# Patient Record
Sex: Male | Born: 1955
Health system: Southern US, Community
[De-identification: ages and names within clinical notes are randomized; demographics above are authoritative.]

## PROBLEM LIST (undated history)

## (undated) DIAGNOSIS — T7840XA Allergy, unspecified, initial encounter: Secondary | ICD-10-CM

## (undated) DIAGNOSIS — M1990 Unspecified osteoarthritis, unspecified site: Secondary | ICD-10-CM

## (undated) DIAGNOSIS — E119 Type 2 diabetes mellitus without complications: Secondary | ICD-10-CM

## (undated) HISTORY — DX: Type 2 diabetes mellitus without complications: E11.9

## (undated) HISTORY — DX: Unspecified osteoarthritis, unspecified site: M19.90

## (undated) HISTORY — DX: Allergy, unspecified, initial encounter: T78.40XA

---

## 1999-12-10 HISTORY — PX: SPINE SURGERY: SHX786

## 2013-08-04 LAB — HM DIABETES EYE EXAM

## 2014-08-18 LAB — HM DIABETES EYE EXAM

## 2015-01-04 DIAGNOSIS — M79642 Pain in left hand: Secondary | ICD-10-CM | POA: Insufficient documentation

## 2015-01-11 LAB — HM DIABETES EYE EXAM

## 2015-04-19 ENCOUNTER — Ambulatory Visit: Payer: Self-pay | Admitting: Ophthalmology

## 2015-04-19 LAB — HM DIABETES EYE EXAM

## 2015-07-05 ENCOUNTER — Other Ambulatory Visit: Payer: Self-pay

## 2015-07-12 ENCOUNTER — Ambulatory Visit: Payer: Self-pay | Admitting: Internal Medicine

## 2015-10-18 ENCOUNTER — Other Ambulatory Visit: Payer: Self-pay

## 2015-10-25 ENCOUNTER — Ambulatory Visit: Payer: Self-pay | Admitting: Internal Medicine

## 2015-11-01 ENCOUNTER — Ambulatory Visit: Payer: Self-pay | Admitting: Internal Medicine

## 2015-11-01 DIAGNOSIS — E119 Type 2 diabetes mellitus without complications: Secondary | ICD-10-CM | POA: Insufficient documentation

## 2015-11-09 ENCOUNTER — Ambulatory Visit: Payer: Self-pay | Admitting: Ophthalmology

## 2015-11-09 LAB — HM DIABETES EYE EXAM

## 2016-01-10 DIAGNOSIS — M79642 Pain in left hand: Secondary | ICD-10-CM

## 2016-01-10 DIAGNOSIS — E119 Type 2 diabetes mellitus without complications: Secondary | ICD-10-CM

## 2016-02-06 ENCOUNTER — Other Ambulatory Visit: Payer: Self-pay

## 2016-02-06 DIAGNOSIS — Z139 Encounter for screening, unspecified: Secondary | ICD-10-CM

## 2016-02-06 DIAGNOSIS — E119 Type 2 diabetes mellitus without complications: Secondary | ICD-10-CM

## 2016-02-07 ENCOUNTER — Ambulatory Visit: Payer: Self-pay

## 2016-02-07 DIAGNOSIS — E119 Type 2 diabetes mellitus without complications: Secondary | ICD-10-CM

## 2016-02-07 DIAGNOSIS — Z139 Encounter for screening, unspecified: Secondary | ICD-10-CM

## 2016-02-08 LAB — HEMOGLOBIN A1C
Est. average glucose Bld gHb Est-mCnc: 140 mg/dL
Hgb A1c MFr Bld: 6.5 % — ABNORMAL HIGH (ref 4.8–5.6)

## 2016-02-08 LAB — COMPREHENSIVE METABOLIC PANEL
ALBUMIN: 4.7 g/dL (ref 3.5–5.5)
ALT: 59 IU/L — AB (ref 0–44)
AST: 31 IU/L (ref 0–40)
Albumin/Globulin Ratio: 1.8 (ref 1.1–2.5)
Alkaline Phosphatase: 60 IU/L (ref 39–117)
BUN / CREAT RATIO: 24 — AB (ref 9–20)
BUN: 16 mg/dL (ref 6–24)
Bilirubin Total: 0.4 mg/dL (ref 0.0–1.2)
CHLORIDE: 98 mmol/L (ref 96–106)
CO2: 22 mmol/L (ref 18–29)
CREATININE: 0.68 mg/dL — AB (ref 0.76–1.27)
Calcium: 9.7 mg/dL (ref 8.7–10.2)
GFR calc non Af Amer: 105 mL/min/{1.73_m2} (ref >59)
GFR, EST AFRICAN AMERICAN: 122 mL/min/{1.73_m2} (ref >59)
GLUCOSE: 155 mg/dL — AB (ref 65–99)
Globulin, Total: 2.6 g/dL (ref 1.5–4.5)
Potassium: 4.2 mmol/L (ref 3.5–5.2)
Sodium: 140 mmol/L (ref 134–144)
TOTAL PROTEIN: 7.3 g/dL (ref 6.0–8.5)

## 2016-02-08 LAB — URINALYSIS
Bilirubin, UA: NEGATIVE
KETONES UA: NEGATIVE
LEUKOCYTES UA: NEGATIVE
Nitrite, UA: NEGATIVE
RBC, UA: NEGATIVE
Specific Gravity, UA: 1.025 (ref 1.005–1.030)
Urobilinogen, Ur: 1 mg/dL (ref 0.2–1.0)
pH, UA: 5 (ref 5.0–7.5)

## 2016-02-08 LAB — CBC WITH DIFFERENTIAL/PLATELET
BASOS ABS: 0 10*3/uL (ref 0.0–0.2)
Basos: 0 %
EOS (ABSOLUTE): 0.2 10*3/uL (ref 0.0–0.4)
EOS: 3 %
HEMATOCRIT: 44 % (ref 37.5–51.0)
HEMOGLOBIN: 14.6 g/dL (ref 12.6–17.7)
IMMATURE GRANS (ABS): 0 10*3/uL (ref 0.0–0.1)
Immature Granulocytes: 0 %
LYMPHS ABS: 2.2 10*3/uL (ref 0.7–3.1)
LYMPHS: 33 %
MCH: 28.7 pg (ref 26.6–33.0)
MCHC: 33.2 g/dL (ref 31.5–35.7)
MCV: 86 fL (ref 79–97)
MONOCYTES: 9 %
Monocytes Absolute: 0.6 10*3/uL (ref 0.1–0.9)
NEUTROS ABS: 3.7 10*3/uL (ref 1.4–7.0)
Neutrophils: 55 %
Platelets: 220 10*3/uL (ref 150–379)
RBC: 5.09 x10E6/uL (ref 4.14–5.80)
RDW: 13.4 % (ref 12.3–15.4)
WBC: 6.8 10*3/uL (ref 3.4–10.8)

## 2016-02-08 LAB — LIPID PANEL
CHOL/HDL RATIO: 4.5 ratio (ref 0.0–5.0)
CHOLESTEROL TOTAL: 184 mg/dL (ref 100–199)
HDL: 41 mg/dL (ref >39)
LDL CALC: 126 mg/dL — AB (ref 0–99)
TRIGLYCERIDES: 86 mg/dL (ref 0–149)
VLDL CHOLESTEROL CAL: 17 mg/dL (ref 5–40)

## 2016-02-08 LAB — MICROALBUMIN / CREATININE URINE RATIO
Creatinine, Urine: 127.1 mg/dL
MICROALB/CREAT RATIO: 70.7 mg/g{creat} — AB (ref 0.0–30.0)
MICROALBUM., U, RANDOM: 89.8 ug/mL

## 2016-02-14 ENCOUNTER — Encounter: Payer: Self-pay | Admitting: Internal Medicine

## 2016-02-14 ENCOUNTER — Ambulatory Visit: Payer: Self-pay | Admitting: Internal Medicine

## 2016-02-14 VITALS — BP 111/62 | HR 87 | Wt 237.0 lb

## 2016-02-14 DIAGNOSIS — E119 Type 2 diabetes mellitus without complications: Secondary | ICD-10-CM

## 2016-02-14 DIAGNOSIS — M25562 Pain in left knee: Secondary | ICD-10-CM

## 2016-02-14 DIAGNOSIS — I10 Essential (primary) hypertension: Secondary | ICD-10-CM | POA: Insufficient documentation

## 2016-02-14 DIAGNOSIS — M25569 Pain in unspecified knee: Secondary | ICD-10-CM | POA: Insufficient documentation

## 2016-02-14 NOTE — Assessment & Plan Note (Signed)
Stable

## 2016-02-14 NOTE — Assessment & Plan Note (Addendum)
No fluid is knee. Not a suspected infection. If the knee starts to swell, becomes red, [facility].

## 2016-02-14 NOTE — Progress Notes (Signed)
   Subjective:    Patient ID: Steven Lawson, male    DOB: Sep 02, 1956, 60 y.o.   MRN: [id number]  HPI  Pt presents with f/u for diabetes. Pt's blood glucose was 109 this morning. He ate a breakfast of grits and cheese. Last A1c was 6.5.   Pt presents with L knee sore (2 weeks). Lost a toenail on right foot.   Review of Systems     Objective:   Physical Exam   BP 111/62 mmHg  Pulse 87  Wt 237 lb (107.502 kg)  Glucose: 109    Medication List       This list is accurate as of: 02/14/16 11:50 AM.  Always use your most recent med list.               glipiZIDE 5 MG tablet  Commonly known as:  GLUCOTROL  Take 5 mg by mouth daily before breakfast.     metFORMIN 1000 MG tablet  Commonly known as:  GLUCOPHAGE  Take 1,000 mg by mouth 2 (two) times daily with a meal.           Assessment & Plan:  Diabetes ([place]) Diabetes is at target. Reduce Metformin to 500 mg twice a day. Stay on Glipizide 5 mg once a day.   Knee pain No fluid is knee. Not a suspected infection. If the knee starts to swell, becomes red, come back to [facility].  Essential hypertension Stable.   Look at toenails on right foot next time.   MD f/u: 3 months Labs prior: A1c, Met c, CBC, Lipid panel, TSH, uric acid, B12

## 2016-02-14 NOTE — Assessment & Plan Note (Addendum)
Diabetes is at target. Reduce Metformin to 500 mg twice a day. Stay on Glipizide 5 mg once a day.

## 2016-03-07 ENCOUNTER — Ambulatory Visit: Payer: Self-pay | Admitting: Ophthalmology

## 2016-03-14 ENCOUNTER — Ambulatory Visit: Payer: Self-pay | Admitting: Ophthalmology

## 2016-04-24 ENCOUNTER — Ambulatory Visit: Payer: Self-pay | Admitting: Ophthalmology

## 2016-05-15 ENCOUNTER — Other Ambulatory Visit: Payer: Self-pay

## 2016-05-15 DIAGNOSIS — E119 Type 2 diabetes mellitus without complications: Secondary | ICD-10-CM

## 2016-05-16 LAB — URIC ACID: Uric Acid: 6.1 mg/dL (ref 3.7–8.6)

## 2016-05-16 LAB — CBC WITH DIFFERENTIAL/PLATELET
BASOS ABS: 0 10*3/uL (ref 0.0–0.2)
Basos: 0 %
EOS (ABSOLUTE): 0.3 10*3/uL (ref 0.0–0.4)
EOS: 4 %
HEMATOCRIT: 45.8 % (ref 37.5–51.0)
HEMOGLOBIN: 15.2 g/dL (ref 12.6–17.7)
IMMATURE GRANS (ABS): 0 10*3/uL (ref 0.0–0.1)
Immature Granulocytes: 0 %
LYMPHS ABS: 2.4 10*3/uL (ref 0.7–3.1)
LYMPHS: 32 %
MCH: 28.4 pg (ref 26.6–33.0)
MCHC: 33.2 g/dL (ref 31.5–35.7)
MCV: 86 fL (ref 79–97)
MONOCYTES: 6 %
Monocytes Absolute: 0.4 10*3/uL (ref 0.1–0.9)
Neutrophils Absolute: 4.3 10*3/uL (ref 1.4–7.0)
Neutrophils: 58 %
Platelets: 228 10*3/uL (ref 150–379)
RBC: 5.35 x10E6/uL (ref 4.14–5.80)
RDW: 13.4 % (ref 12.3–15.4)
WBC: 7.5 10*3/uL (ref 3.4–10.8)

## 2016-05-16 LAB — COMPREHENSIVE METABOLIC PANEL
ALBUMIN: 4.4 g/dL (ref 3.5–5.5)
ALT: 70 IU/L — AB (ref 0–44)
AST: 37 IU/L (ref 0–40)
Albumin/Globulin Ratio: 1.7 (ref 1.2–2.2)
Alkaline Phosphatase: 58 IU/L (ref 39–117)
BUN / CREAT RATIO: 21 — AB (ref 9–20)
BUN: 16 mg/dL (ref 6–24)
Bilirubin Total: 0.7 mg/dL (ref 0.0–1.2)
CHLORIDE: 100 mmol/L (ref 96–106)
CO2: 19 mmol/L (ref 18–29)
CREATININE: 0.78 mg/dL (ref 0.76–1.27)
Calcium: 9.6 mg/dL (ref 8.7–10.2)
GFR calc non Af Amer: 99 mL/min/{1.73_m2} (ref >59)
GFR, EST AFRICAN AMERICAN: 115 mL/min/{1.73_m2} (ref >59)
GLUCOSE: 194 mg/dL — AB (ref 65–99)
Globulin, Total: 2.6 g/dL (ref 1.5–4.5)
Potassium: 4.2 mmol/L (ref 3.5–5.2)
Sodium: 139 mmol/L (ref 134–144)
TOTAL PROTEIN: 7 g/dL (ref 6.0–8.5)

## 2016-05-16 LAB — VITAMIN B12: Vitamin B-12: 349 pg/mL (ref 211–946)

## 2016-05-16 LAB — LIPID PANEL
CHOL/HDL RATIO: 4.6 ratio (ref 0.0–5.0)
CHOLESTEROL TOTAL: 175 mg/dL (ref 100–199)
HDL: 38 mg/dL — AB (ref >39)
LDL CALC: 118 mg/dL — AB (ref 0–99)
TRIGLYCERIDES: 94 mg/dL (ref 0–149)
VLDL CHOLESTEROL CAL: 19 mg/dL (ref 5–40)

## 2016-05-16 LAB — HEMOGLOBIN A1C
ESTIMATED AVERAGE GLUCOSE: 148 mg/dL
Hgb A1c MFr Bld: 6.8 % — ABNORMAL HIGH (ref 4.8–5.6)

## 2016-05-16 LAB — TSH: TSH: 2.41 u[IU]/mL (ref 0.450–4.500)

## 2016-05-22 ENCOUNTER — Encounter: Payer: Self-pay | Admitting: Internal Medicine

## 2016-05-22 ENCOUNTER — Ambulatory Visit: Payer: Self-pay | Admitting: Internal Medicine

## 2016-05-22 VITALS — BP 117/70 | Temp 98.7°F | Wt 244.0 lb

## 2016-05-22 DIAGNOSIS — E119 Type 2 diabetes mellitus without complications: Secondary | ICD-10-CM

## 2016-05-22 LAB — GLUCOSE, POCT (MANUAL RESULT ENTRY): POC GLUCOSE: 180 mg/dL — AB (ref 70–99)

## 2016-05-22 NOTE — Progress Notes (Signed)
   Subjective:    Patient ID: Steven Lawson, male    DOB: 1956-04-09, 60 y.o.   MRN: [id number]  HPI   Patient following up for diabetes. Blood sugar is elevated at 180.Blood pressure is normal.   Patient Active Problem List   Diagnosis Date Noted  . Knee pain 02/14/2016  . Essential hypertension 02/14/2016  . Diabetes ([place]) 11/01/2015  . Left hand pain 01/04/2015       Review of Systems     Objective:   Physical Exam  Constitutional: He is oriented to person, place, and time.  Cardiovascular: Normal rate and regular rhythm.   Pulmonary/Chest: Effort normal and breath sounds normal.  Neurological: He is alert and oriented to person, place, and time.   Patient's feet are free from any infection or ulcers.   BP 117/70 mmHg  Temp(Src) 98.7 F (37.1 C)  Wt 244 lb (110.678 kg)    Medication List       This list is accurate as of: 05/22/16 10:17 AM.  Always use your most recent med list.               glipiZIDE 5 MG tablet  Commonly known as:  GLUCOTROL  Take 5 mg by mouth daily before breakfast.     metFORMIN 1000 MG tablet  Commonly known as:  GLUCOPHAGE  Take 500 mg by mouth 2 (two) times daily with a meal.             Assessment & Plan:  Patient advised to lose weight to help control diabetes.     Patient needs return in 3 months for labs: a1c met C, CBC, UA, Lipid, TSH, and GGT

## 2016-05-22 NOTE — Patient Instructions (Signed)
Patient needs labs 1 week prior to appt.

## 2016-07-25 ENCOUNTER — Ambulatory Visit: Payer: Self-pay | Admitting: Ophthalmology

## 2016-08-01 ENCOUNTER — Other Ambulatory Visit: Payer: Self-pay

## 2016-08-01 ENCOUNTER — Ambulatory Visit: Payer: Self-pay | Admitting: Ophthalmology

## 2016-08-01 DIAGNOSIS — E119 Type 2 diabetes mellitus without complications: Secondary | ICD-10-CM

## 2016-08-01 MED ORDER — GLIPIZIDE 5 MG PO TABS: 5.0000 mg | ORAL_TABLET | Freq: Every day | ORAL | 3 refills | Status: DC

## 2016-08-21 ENCOUNTER — Other Ambulatory Visit: Payer: Self-pay

## 2016-08-28 ENCOUNTER — Other Ambulatory Visit: Payer: Self-pay

## 2016-08-28 ENCOUNTER — Ambulatory Visit: Payer: Self-pay | Admitting: Internal Medicine

## 2016-08-28 DIAGNOSIS — E119 Type 2 diabetes mellitus without complications: Secondary | ICD-10-CM

## 2016-08-29 LAB — CBC WITH DIFFERENTIAL/PLATELET
BASOS: 0 %
Basophils Absolute: 0 10*3/uL (ref 0.0–0.2)
EOS (ABSOLUTE): 0.3 10*3/uL (ref 0.0–0.4)
EOS: 3 %
Hematocrit: 45.2 % (ref 37.5–51.0)
Hemoglobin: 15.5 g/dL (ref 12.6–17.7)
IMMATURE GRANS (ABS): 0 10*3/uL (ref 0.0–0.1)
IMMATURE GRANULOCYTES: 0 %
LYMPHS: 45 %
Lymphocytes Absolute: 3.5 10*3/uL — ABNORMAL HIGH (ref 0.7–3.1)
MCH: 29 pg (ref 26.6–33.0)
MCHC: 34.3 g/dL (ref 31.5–35.7)
MCV: 85 fL (ref 79–97)
Monocytes Absolute: 0.6 10*3/uL (ref 0.1–0.9)
Monocytes: 8 %
NEUTROS PCT: 44 %
Neutrophils Absolute: 3.4 10*3/uL (ref 1.4–7.0)
PLATELETS: 244 10*3/uL (ref 150–379)
RBC: 5.35 x10E6/uL (ref 4.14–5.80)
RDW: 13.7 % (ref 12.3–15.4)
WBC: 7.8 10*3/uL (ref 3.4–10.8)

## 2016-08-29 LAB — URINALYSIS
BILIRUBIN UA: NEGATIVE
Glucose, UA: NEGATIVE
Ketones, UA: NEGATIVE
Leukocytes, UA: NEGATIVE
NITRITE UA: NEGATIVE
Protein, UA: NEGATIVE
RBC UA: NEGATIVE
Specific Gravity, UA: 1.024 (ref 1.005–1.030)
UUROB: 1 mg/dL (ref 0.2–1.0)
pH, UA: 5 (ref 5.0–7.5)

## 2016-08-29 LAB — COMPREHENSIVE METABOLIC PANEL
ALT: 60 IU/L — AB (ref 0–44)
AST: 36 IU/L (ref 0–40)
Albumin/Globulin Ratio: 1.9 (ref 1.2–2.2)
Albumin: 4.7 g/dL (ref 3.5–5.5)
Alkaline Phosphatase: 63 IU/L (ref 39–117)
BUN/Creatinine Ratio: 20 (ref 9–20)
BUN: 16 mg/dL (ref 6–24)
Bilirubin Total: 0.8 mg/dL (ref 0.0–1.2)
CALCIUM: 9.7 mg/dL (ref 8.7–10.2)
CO2: 25 mmol/L (ref 18–29)
CREATININE: 0.8 mg/dL (ref 0.76–1.27)
Chloride: 102 mmol/L (ref 96–106)
GFR calc Af Amer: 114 mL/min/{1.73_m2} (ref >59)
GFR, EST NON AFRICAN AMERICAN: 98 mL/min/{1.73_m2} (ref >59)
Globulin, Total: 2.5 g/dL (ref 1.5–4.5)
Glucose: 77 mg/dL (ref 65–99)
Potassium: 4.7 mmol/L (ref 3.5–5.2)
Sodium: 142 mmol/L (ref 134–144)
Total Protein: 7.2 g/dL (ref 6.0–8.5)

## 2016-08-29 LAB — TSH: TSH: 1.77 u[IU]/mL (ref 0.450–4.500)

## 2016-08-29 LAB — GAMMA GT: GGT: 32 IU/L (ref 0–65)

## 2016-08-29 LAB — CHOLESTEROL, TOTAL: Cholesterol, Total: 169 mg/dL (ref 100–199)

## 2016-08-29 LAB — HEMOGLOBIN A1C
ESTIMATED AVERAGE GLUCOSE: 134 mg/dL
HEMOGLOBIN A1C: 6.3 % — AB (ref 4.8–5.6)

## 2016-09-04 ENCOUNTER — Ambulatory Visit: Payer: Self-pay | Admitting: Internal Medicine

## 2016-09-04 ENCOUNTER — Encounter: Payer: Self-pay | Admitting: Internal Medicine

## 2016-09-04 VITALS — BP 108/65 | HR 87 | Temp 98.7°F | Wt 241.0 lb

## 2016-09-04 DIAGNOSIS — E119 Type 2 diabetes mellitus without complications: Secondary | ICD-10-CM

## 2016-09-04 LAB — GLUCOSE, POCT (MANUAL RESULT ENTRY): POC GLUCOSE: 120 mg/dL — AB (ref 70–99)

## 2016-09-04 NOTE — Progress Notes (Signed)
   Subjective:    Patient ID: Steven Lawson, male    DOB: 1956/08/19, 60 y.o.   MRN: [id number]  HPI  F/u for diabetes and hypertension. BP and A1C at target.  Pt complains of sores and edema on both legs Pt has a day and evening job.   Patient Active Problem List   Diagnosis Date Noted  . Knee pain 02/14/2016  . Essential hypertension 02/14/2016  . Diabetes ([place]) 11/01/2015  . Left hand pain 01/04/2015     Medication List       Accurate as of 09/04/16  9:49 AM. Always use your most recent med list.          aspirin 325 MG tablet Take 325 mg by mouth as needed.   glipiZIDE 5 MG tablet Commonly known as:  GLUCOTROL Take 1 tablet (5 mg total) by mouth daily before breakfast.   metFORMIN 1000 MG tablet Commonly known as:  GLUCOPHAGE Take 500 mg by mouth 2 (two) times daily with a meal.         Review of Systems  Edema in both lower legs. Works at night on his feet.     Objective:   Physical Exam  Constitutional: He is oriented to person, place, and time.  Cardiovascular: Normal rate, regular rhythm and normal heart sounds.   Pulmonary/Chest: Effort normal and breath sounds normal.  Neurological: He is alert and oriented to person, place, and time.    BP 108/65   Pulse 87   Temp 98.7 F (37.1 C) (Oral)   Wt 241 lb (109.3 kg)        Assessment & Plan:   Pt advised to prop his legs up when he is lying down.   F/u in 3 months w/ labs: A1C, Met C, CBC, Lipid, PSA

## 2016-09-04 NOTE — Patient Instructions (Signed)
F/u in 3 months w/ labs: A1C, Met C, CBC, Lipid, PSA

## 2016-09-19 ENCOUNTER — Ambulatory Visit: Payer: Self-pay | Admitting: Ophthalmology

## 2016-11-27 ENCOUNTER — Other Ambulatory Visit: Payer: Self-pay

## 2016-11-27 DIAGNOSIS — E119 Type 2 diabetes mellitus without complications: Secondary | ICD-10-CM

## 2016-11-28 LAB — CBC
HEMATOCRIT: 42.9 % (ref 37.5–51.0)
HEMOGLOBIN: 14.9 g/dL (ref 13.0–17.7)
MCH: 29.6 pg (ref 26.6–33.0)
MCHC: 34.7 g/dL (ref 31.5–35.7)
MCV: 85 fL (ref 79–97)
Platelets: 230 10*3/uL (ref 150–379)
RBC: 5.03 x10E6/uL (ref 4.14–5.80)
RDW: 13.4 % (ref 12.3–15.4)
WBC: 6.4 10*3/uL (ref 3.4–10.8)

## 2016-11-28 LAB — COMPREHENSIVE METABOLIC PANEL
A/G RATIO: 1.9 (ref 1.2–2.2)
ALBUMIN: 4.7 g/dL (ref 3.5–5.5)
ALT: 68 IU/L — ABNORMAL HIGH (ref 0–44)
AST: 32 IU/L (ref 0–40)
Alkaline Phosphatase: 67 IU/L (ref 39–117)
BILIRUBIN TOTAL: 0.7 mg/dL (ref 0.0–1.2)
BUN / CREAT RATIO: 30 — AB (ref 9–20)
BUN: 22 mg/dL (ref 6–24)
CALCIUM: 9.9 mg/dL (ref 8.7–10.2)
CHLORIDE: 102 mmol/L (ref 96–106)
CO2: 22 mmol/L (ref 18–29)
Creatinine, Ser: 0.74 mg/dL — ABNORMAL LOW (ref 0.76–1.27)
GFR, EST AFRICAN AMERICAN: 117 mL/min/{1.73_m2} (ref >59)
GFR, EST NON AFRICAN AMERICAN: 101 mL/min/{1.73_m2} (ref >59)
Globulin, Total: 2.5 g/dL (ref 1.5–4.5)
Glucose: 201 mg/dL — ABNORMAL HIGH (ref 65–99)
POTASSIUM: 4.7 mmol/L (ref 3.5–5.2)
Sodium: 145 mmol/L — ABNORMAL HIGH (ref 134–144)
TOTAL PROTEIN: 7.2 g/dL (ref 6.0–8.5)

## 2016-11-28 LAB — LIPID PANEL
CHOL/HDL RATIO: 3.7 ratio (ref 0.0–5.0)
CHOLESTEROL TOTAL: 176 mg/dL (ref 100–199)
HDL: 47 mg/dL (ref >39)
LDL CALC: 112 mg/dL — AB (ref 0–99)
TRIGLYCERIDES: 87 mg/dL (ref 0–149)
VLDL CHOLESTEROL CAL: 17 mg/dL (ref 5–40)

## 2016-11-28 LAB — PSA: Prostate Specific Ag, Serum: 0.4 ng/mL (ref 0.0–4.0)

## 2016-11-28 LAB — HEMOGLOBIN A1C
Est. average glucose Bld gHb Est-mCnc: 171 mg/dL
Hgb A1c MFr Bld: 7.6 % — ABNORMAL HIGH (ref 4.8–5.6)

## 2016-12-04 ENCOUNTER — Ambulatory Visit: Payer: Self-pay | Admitting: Internal Medicine

## 2016-12-04 ENCOUNTER — Encounter: Payer: Self-pay | Admitting: Internal Medicine

## 2016-12-04 VITALS — BP 130/75 | HR 91 | Temp 98.2°F | Wt 236.0 lb

## 2016-12-04 DIAGNOSIS — E119 Type 2 diabetes mellitus without complications: Secondary | ICD-10-CM

## 2016-12-04 LAB — GLUCOSE, POCT (MANUAL RESULT ENTRY): POC Glucose: 172 mg/dl — AB (ref 70–99)

## 2016-12-04 MED ORDER — METFORMIN HCL 1000 MG PO TABS: 500.0000 mg | ORAL_TABLET | Freq: Two times a day (BID) | ORAL | 3 refills | Status: DC

## 2016-12-04 MED ORDER — GLIPIZIDE 5 MG PO TABS: 5.0000 mg | ORAL_TABLET | Freq: Every day | ORAL | 3 refills | Status: DC

## 2016-12-04 NOTE — Patient Instructions (Signed)
RTC in 3 months for diabetes with labs the week before met b and a1c.

## 2016-12-04 NOTE — Progress Notes (Signed)
   Subjective:    Patient ID: Steven Lawson, male    DOB: 1956-02-16, 60 y.o.   MRN: [id number]  HPI BP 130/75   Pulse 91   Temp 98.2 F (36.8 C) (Oral)   Wt 236 lb (107 kg)   Allergies as of 12/04/2016      Reactions   Catfish [fish Allergy]    Morphine And Related    Pollen Extract Other (See Comments)   Runny nose, watery eyes, dry cough, rash       Medication List       Accurate as of 12/04/16 10:36 AM. Always use your most recent med list.          aspirin 325 MG tablet Take 325 mg by mouth as needed.   glipiZIDE 5 MG tablet Commonly known as:  GLUCOTROL Take 1 tablet (5 mg total) by mouth daily before breakfast.   metFORMIN 1000 MG tablet Commonly known as:  GLUCOPHAGE Take 500 mg by mouth 2 (two) times daily with a meal.      Pt presents with eye problem that has lasted more than 6 months. Pt states its very itchy. Pt states it feels like its gettting infected. Pt has trouble with feet, moderate swelling and numbness. pT c/o of runny nose like water. Pt takes OTC allergy medicine.     Review of Systems     Objective:   Physical Exam  Constitutional: He is oriented to person, place, and time.  Cardiovascular: Normal rate, regular rhythm and normal heart sounds.   Pulmonary/Chest: Effort normal and breath sounds normal.  Neurological: He is alert and oriented to person, place, and time. He has normal reflexes.          Assessment & Plan:  Need appt with hatch next wed. Bp looks good. Weight is coming down. a1c is elevated since last visit. Refills for metformin and glipizide. Exercise is good for his diabetes. Rotate allergy medicine claritin and zyrtec.  RTC in 3 months, labs the week before a1c and met b.

## 2016-12-11 ENCOUNTER — Ambulatory Visit: Payer: Self-pay | Admitting: Ophthalmology

## 2017-02-25 LAB — HM DIABETES EYE EXAM

## 2017-02-26 ENCOUNTER — Other Ambulatory Visit: Payer: Self-pay | Admitting: Ophthalmology

## 2017-03-05 ENCOUNTER — Other Ambulatory Visit: Payer: Self-pay

## 2017-03-05 DIAGNOSIS — E119 Type 2 diabetes mellitus without complications: Secondary | ICD-10-CM

## 2017-03-06 LAB — BASIC METABOLIC PANEL
BUN / CREAT RATIO: 37 — AB (ref 9–20)
BUN: 30 mg/dL — ABNORMAL HIGH (ref 6–24)
CHLORIDE: 96 mmol/L (ref 96–106)
CO2: 23 mmol/L (ref 18–29)
Calcium: 9 mg/dL (ref 8.7–10.2)
Creatinine, Ser: 0.82 mg/dL (ref 0.76–1.27)
GFR calc non Af Amer: 97 mL/min/{1.73_m2} (ref >59)
GFR, EST AFRICAN AMERICAN: 112 mL/min/{1.73_m2} (ref >59)
Glucose: 269 mg/dL — ABNORMAL HIGH (ref 65–99)
POTASSIUM: 4.4 mmol/L (ref 3.5–5.2)
SODIUM: 137 mmol/L (ref 134–144)

## 2017-03-06 LAB — HEMOGLOBIN A1C
ESTIMATED AVERAGE GLUCOSE: 186 mg/dL
HEMOGLOBIN A1C: 8.1 % — AB (ref 4.8–5.6)

## 2017-03-06 NOTE — Progress Notes (Signed)
Pt needs a visit asap to discuss lab results,

## 2017-03-12 ENCOUNTER — Encounter: Payer: Self-pay | Admitting: Internal Medicine

## 2017-03-12 ENCOUNTER — Ambulatory Visit: Payer: Self-pay | Admitting: Internal Medicine

## 2017-03-12 VITALS — BP 117/67 | HR 82 | Temp 98.4°F | Wt 248.0 lb

## 2017-03-12 DIAGNOSIS — E119 Type 2 diabetes mellitus without complications: Secondary | ICD-10-CM

## 2017-03-12 LAB — GLUCOSE, POCT (MANUAL RESULT ENTRY): POC GLUCOSE: 152 mg/dL — AB (ref 70–99)

## 2017-03-12 NOTE — Progress Notes (Signed)
   Subjective:    Patient ID: Steven Lawson, male    DOB: [birth year], 61 y.o.   MRN: [id number]  HPI   Pt presents for 3 mo F/u  Pt complains of R foot pain. Pain began last week. Reports it had been irritated before but pain is recent.  Pt reports vomiting the other day. Likely from something he ate.   Patient Active Problem List   Diagnosis Date Noted  . Knee pain 02/14/2016  . Essential hypertension 02/14/2016  . Diabetes (HCC) 11/01/2015  . Left hand pain 01/04/2015   Allergies as of 03/12/2017      Reactions   Dust Mite Extract    Watery eyes, runny nose, nausea, dry cough, rash   Morphine And Related    Pollen Extract Other (See Comments)   Runny nose, watery eyes, dry cough, rash       Medication List       Accurate as of 03/12/17 10:07 AM. Always use your most recent med list.          aspirin 325 MG tablet Take 325 mg by mouth as needed.   glipiZIDE 5 MG tablet Commonly known as:  GLUCOTROL Take 1 tablet (5 mg total) by mouth daily before breakfast.   metFORMIN 1000 MG tablet Commonly known as:  GLUCOPHAGE Take 0.5 tablets (500 mg total) by mouth 2 (two) times daily with a meal.        Review of Systems     Objective:   Physical Exam  Constitutional: He is oriented to person, place, and time.  Cardiovascular: Normal rate, regular rhythm and normal heart sounds.   Pulmonary/Chest: Effort normal and breath sounds normal.  Neurological: He is alert and oriented to person, place, and time.    BP 117/67   Pulse 82   Temp 98.4 F (36.9 C) (Oral)   Wt 248 lb (112.5 kg)   Pt is diabetic w/ pain in R 4th toe. Pain is in center of toe. Good pulses in foot. Doesn't appear to be gout. Sugars are not as good. Pt's new insurance will be coming into effect.       Assessment & Plan:   Labs today: CBC, Uric Acid Referral for xray of R foot for pain in 4th toe.  No f/u apt needed b/c pt is transitioning to insurance

## 2017-03-12 NOTE — Patient Instructions (Signed)
Labs today Referral for xray of R foot for toe pain

## 2017-03-13 LAB — CBC
Hematocrit: 41.9 % (ref 37.5–51.0)
Hemoglobin: 14.7 g/dL (ref 13.0–17.7)
MCH: 29.8 pg (ref 26.6–33.0)
MCHC: 35.1 g/dL (ref 31.5–35.7)
MCV: 85 fL (ref 79–97)
PLATELETS: 255 10*3/uL (ref 150–379)
RBC: 4.93 x10E6/uL (ref 4.14–5.80)
RDW: 13.7 % (ref 12.3–15.4)
WBC: 7.4 10*3/uL (ref 3.4–10.8)

## 2017-03-13 LAB — URIC ACID: Uric Acid: 5.2 mg/dL (ref 3.7–8.6)

## 2017-03-20 ENCOUNTER — Telehealth: Payer: Self-pay

## 2017-03-20 NOTE — Telephone Encounter (Signed)
Called pt with results from Dr. Candelaria Stagers. PT verbalized understanding.

## 2017-03-26 ENCOUNTER — Ambulatory Visit
Admission: RE | Admit: 2017-03-26 | Discharge: 2017-03-26 | Disposition: A | Discharge status: Home / Self Care | Payer: Self-pay | Source: Ambulatory Visit | Attending: Internal Medicine | Admitting: Internal Medicine

## 2017-03-26 DIAGNOSIS — E119 Type 2 diabetes mellitus without complications: Secondary | ICD-10-CM

## 2017-03-26 DIAGNOSIS — M79671 Pain in right foot: Secondary | ICD-10-CM | POA: Insufficient documentation

## 2017-07-31 ENCOUNTER — Ambulatory Visit: Payer: Self-pay | Admitting: Ophthalmology

## 2017-12-04 ENCOUNTER — Ambulatory Visit: Payer: Self-pay | Admitting: Ophthalmology

## 2017-12-05 ENCOUNTER — Ambulatory Visit (INDEPENDENT_AMBULATORY_CARE_PROVIDER_SITE_OTHER): Payer: 59 | Admitting: Physician Assistant

## 2017-12-05 ENCOUNTER — Encounter: Payer: Self-pay | Admitting: Physician Assistant

## 2017-12-05 VITALS — BP 138/88 | HR 78 | Temp 97.0°F | Resp 16 | Ht 68.0 in | Wt 251.0 lb

## 2017-12-05 DIAGNOSIS — Z114 Encounter for screening for human immunodeficiency virus [HIV]: Secondary | ICD-10-CM

## 2017-12-05 DIAGNOSIS — E119 Type 2 diabetes mellitus without complications: Secondary | ICD-10-CM

## 2017-12-05 DIAGNOSIS — Z1159 Encounter for screening for other viral diseases: Secondary | ICD-10-CM

## 2017-12-05 LAB — POCT UA - MICROALBUMIN: Microalbumin Ur, POC: 20 mg/L

## 2017-12-05 MED ORDER — GLIPIZIDE 5 MG PO TABS: 5.0000 mg | ORAL_TABLET | Freq: Every day | ORAL | 0 refills | Status: DC

## 2017-12-05 MED ORDER — METFORMIN HCL 1000 MG PO TABS: 500.0000 mg | ORAL_TABLET | Freq: Two times a day (BID) | ORAL | 0 refills | Status: DC

## 2017-12-05 NOTE — Patient Instructions (Signed)

## 2017-12-05 NOTE — Progress Notes (Signed)
Patient: Steven Lawson Male    DOB: 1956-07-03   61 y.o.   MRN: [id number] Visit Date: 12/05/2017  Today's Provider: Trey SailorsAdriana M Pollak, PA-C   Chief Complaint  Patient presents with  . New Patient (Initial Visit)   Subjective:     HPI   Steven NoaDavid Pesch is a 61 y/o man who is here today to establish care with new provider. He was previously seen at [facility], last seen in April 2018.  [place], is single. No children, no pets. Works as a Electrical engineerecurity guard. Does not drink, smoke, or use drugs.  He has Type II DM. He reports that [facility] He was out of his medication and had to get some before he came here. He says it was "six something." He declines flu vaccine and reports that he has had a cold. Declines Pneumonia vaccine. Last LDL 112 on 11/27/2016, not currently on statin. Not currently on ACEi. He says his last eye exam was with [facility].   He has never had a colonoscopy. He does not want any type of colon cancer screening. He says he tries not to think about getting screened. He says that if he had colon cancer, then "hew would just have to die," and he wouldn't treat it. His PSA last year was normal. Declines repeat testing.      Allergies  Allergen Reactions  . Dust Mite Extract     Watery eyes, runny nose, nausea, dry cough, rash  . Morphine And Related   . Pollen Extract Other (See Comments)    Runny nose, watery eyes, dry cough, rash      Current Outpatient Medications:  .  aspirin 325 MG tablet, Take 325 mg by mouth as needed., Disp: , Rfl:  .  glipiZIDE (GLUCOTROL) 5 MG tablet, Take 1 tablet (5 mg total) by mouth daily before breakfast., Disp: 90 tablet, Rfl: 3 .  metFORMIN (GLUCOPHAGE) 1000 MG tablet, Take 0.5 tablets (500 mg total) by mouth 2 (two) times daily with a meal., Disp: 90 tablet, Rfl: 3  Review of Systems  Constitutional: Negative.   HENT: Positive for congestion, dental problem,  ear pain, nosebleeds, postnasal drip, sinus pressure, sneezing, sore throat and tinnitus.   Eyes: Positive for photophobia, discharge, redness and itching.  Respiratory: Positive for cough, chest tightness, shortness of breath and wheezing.        He reports that he gets symptoms when he gets a cold or he exerting himself.   Cardiovascular: Negative.   Gastrointestinal: Positive for constipation and diarrhea.  Endocrine: Negative.   Genitourinary: Negative.   Musculoskeletal: Positive for arthralgias, back pain, gait problem, myalgias, neck pain and neck stiffness.  Skin: Positive for rash and wound.  Allergic/Immunologic: Positive for environmental allergies.  Neurological: Positive for tremors and headaches.  Hematological: Negative.   Psychiatric/Behavioral: Negative.     Social History   Tobacco Use  . Smoking status: Never Smoker  . Smokeless tobacco: Never Used  Substance Use Topics  . Alcohol use: Yes    Alcohol/week: 0.0 oz    Comment: occasional   Objective:   BP 138/88 (BP Location: Left Arm, Patient Position: Sitting, Cuff Size: Normal)   Pulse 78   Temp (!) 97 F (36.1 C) (Oral)   Resp 16   Ht 5\' 8"  (1.727 m)   Wt 251 lb (113.9 kg)   BMI 38.16 kg/m  Vitals:   12/05/17 1419  BP: 138/88  Pulse: 78  Resp: 16  Temp: (!) 97 F (36.1 C)  TempSrc: Oral  Weight: 251 lb (113.9 kg)  Height: 5\' 8"  (1.727 m)     Physical Exam  Constitutional: He is oriented to person, place, and time. He appears well-developed and well-nourished.  Cardiovascular: Normal rate and regular rhythm.  Pulmonary/Chest: Effort normal and breath sounds normal.  Neurological: He is alert and oriented to person, place, and time.  Skin: Skin is warm and dry.  Psychiatric: He has a normal mood and affect. His behavior is normal.    Diabetic Foot Exam - Simple   Simple Foot Form Diabetic Foot exam was performed with the following findings:  Yes 12/05/2017  2:55 PM  Visual  Inspection See comments:  Yes Sensation Testing Intact to touch and monofilament testing bilaterally:  Yes Pulse Check Posterior Tibialis and Dorsalis pulse intact bilaterally:  Yes Comments Interdigital regions are macerated with fungus, though there are no ulcers. Onychomycosis on several toe nails.        Assessment & Plan:     1. Type 2 diabetes mellitus without complication, unspecified whether long term insulin use (HCC)  His microalbumin today is 20. We will continue to monitor this. Will get labs as below. Previously, cholesterol was rather well controlled off statin. See him back in three months to check A1c and review labwork. Counseled on proper foot care and importance of wearing house slippers.  - POCT UA - Microalbumin - Comprehensive Metabolic Panel (CMET) - Lipid Profile  2. Encounter for screening for HIV  - HIV antibody (with reflex)  3. Encounter for hepatitis C screening test for low risk patient  - Hepatitis C antibody  4. Type 2 diabetes mellitus without complication, without long-term current use of insulin (HCC)  - glipiZIDE (GLUCOTROL) 5 MG tablet; Take 1 tablet (5 mg total) by mouth daily before breakfast.  Dispense: 90 tablet; Refill: 0 - metFORMIN (GLUCOPHAGE) 1000 MG tablet; Take 0.5 tablets (500 mg total) by mouth 2 (two) times daily with a meal.  Dispense: 90 tablet; Refill: 0  Return in about 3 months (around 03/05/2018) for Diabetes .  The entirety of the information documented in the History of Present Illness, Review of Systems and Physical Exam were personally obtained by me. Portions of this information were initially documented by Kavin LeechLaura Walsh, CMA and reviewed by me for thoroughness and accuracy.        Trey SailorsAdriana M Pollak, PA-C  [facility]

## 2017-12-11 ENCOUNTER — Ambulatory Visit: Payer: Self-pay | Admitting: Ophthalmology

## 2017-12-12 LAB — HM DIABETES EYE EXAM

## 2017-12-16 ENCOUNTER — Encounter: Payer: Self-pay | Admitting: Physician Assistant

## 2018-03-18 ENCOUNTER — Telehealth: Payer: Self-pay | Admitting: Physician Assistant

## 2018-03-18 NOTE — Telephone Encounter (Signed)
Faxed [facility] [address] on 12.28.2018

## 2018-04-25 ENCOUNTER — Emergency Department
Admission: EM | Admit: 2018-04-25 | Discharge: 2018-04-25 | Disposition: A | Discharge status: Home / Self Care | Payer: 59 | Attending: Emergency Medicine | Admitting: Emergency Medicine

## 2018-04-25 ENCOUNTER — Encounter: Payer: Self-pay | Admitting: Emergency Medicine

## 2018-04-25 DIAGNOSIS — R110 Nausea: Secondary | ICD-10-CM | POA: Insufficient documentation

## 2018-04-25 DIAGNOSIS — Z7984 Long term (current) use of oral hypoglycemic drugs: Secondary | ICD-10-CM | POA: Diagnosis not present

## 2018-04-25 DIAGNOSIS — Z760 Encounter for issue of repeat prescription: Secondary | ICD-10-CM | POA: Diagnosis not present

## 2018-04-25 DIAGNOSIS — E119 Type 2 diabetes mellitus without complications: Secondary | ICD-10-CM | POA: Diagnosis not present

## 2018-04-25 DIAGNOSIS — I10 Essential (primary) hypertension: Secondary | ICD-10-CM | POA: Diagnosis not present

## 2018-04-25 LAB — GLUCOSE, CAPILLARY: Glucose-Capillary: 277 mg/dL — ABNORMAL HIGH (ref 65–99)

## 2018-04-25 MED ORDER — GLIPIZIDE 5 MG PO TABS: 5.0000 mg | ORAL_TABLET | Freq: Every day | ORAL | 0 refills | Status: DC

## 2018-04-25 MED ORDER — METFORMIN HCL 1000 MG PO TABS: 500.0000 mg | ORAL_TABLET | Freq: Two times a day (BID) | ORAL | 0 refills | Status: DC

## 2018-04-25 NOTE — ED Notes (Signed)

## 2018-04-25 NOTE — ED Triage Notes (Signed)
FIRST NURSE NOTE-ran out of his glipizide and metformin. Reports checked sugar just a little bit ago and was 285. Ambulatory. NAD

## 2018-04-25 NOTE — ED Triage Notes (Signed)
Patient states he ran out of his glipizide and metformin on Monday.  Patient states he has an appointment next week with pcp but his blood sugar today was 285 so he became concerned.  Patient states, "i've just been trying to be careful and watch what I eat this week and keep a check on it, but I probably need to go ahead and get more medicine."

## 2018-04-25 NOTE — ED Provider Notes (Signed)
[facility] Emergency Department Provider Note  ____________________________________________  Time seen: Approximately 3:34 PM  I have reviewed the triage vital signs and the nursing notes.   HISTORY  Chief Complaint Medication Refill    HPI Steven Lawson is a 62 y.o. male that presents emergency department for medication refill.  Patient ran out of his glipizide and metformin on Monday.  He felt a little nauseous on Tuesday and Wednesday but has felt fine since.  He denies any symptoms today.  He checked his blood sugar today and it was in the 280s so he came to the emergency department for a refill of these medications.  He has an appointment with PCP on Friday.   Past Medical History:  Diagnosis Date  . Allergy   . Arthritis   . Diabetes mellitus without complication [facility])     Patient Active Problem List   Diagnosis Date Noted  . Knee pain 02/14/2016  . Essential hypertension 02/14/2016  . Diabetes (HCC) 11/01/2015  . Left hand pain 01/04/2015    Past Surgical History:  Procedure Laterality Date  . SPINE SURGERY  2001    Prior to Admission medications   Medication Sig Start Date End Date Taking? Authorizing Provider  aspirin 325 MG tablet Take 325 mg by mouth as needed.    [provider]  glipiZIDE (GLUCOTROL) 5 MG tablet Take 1 tablet (5 mg total) by mouth daily before breakfast. 04/25/18   Enid Derry, PA-C  metFORMIN (GLUCOPHAGE) 1000 MG tablet Take 0.5 tablets (500 mg total) by mouth 2 (two) times daily with a meal. 04/25/18   Enid Derry, PA-C    Allergies Dust mite extract; Morphine and related; and Pollen extract  Family History  Problem Relation Age of Onset  . Diabetes Mother   . Diabetes Father   . Pancreatitis Father   . Cancer Brother   . Heart disease Maternal Grandfather   . COPD Paternal Grandfather   . Liver disease Brother     Social History Social History   Tobacco Use  . Smoking status: Never  Smoker  . Smokeless tobacco: Never Used  Substance Use Topics  . Alcohol use: Yes    Alcohol/week: 0.0 oz    Comment: occasional  . Drug use: No     Review of Systems  Constitutional: No fever/chills Gastrointestinal: No abdominal pain.  No nausea, no vomiting today.  Musculoskeletal: Negative for musculoskeletal pain. Skin: Negative for rash, abrasions, lacerations, ecchymosis. Neurological: Negative for headaches, numbness or tingling   ____________________________________________   PHYSICAL EXAM:  VITAL SIGNS: ED Triage Vitals  Enc Vitals Group     BP 04/25/18 1459 (!) 146/78     Pulse Rate 04/25/18 1459 94     Resp 04/25/18 1459 18     Temp 04/25/18 1459 98.9 F (37.2 C)     Temp Source 04/25/18 1459 Oral     SpO2 04/25/18 1459 95 %     Weight 04/25/18 1503 248 lb (112.5 kg)     Height 04/25/18 1503  (1.727 m)     Head Circumference --      Peak Flow --      Pain Score 04/25/18 1503 0     Pain Loc --      Pain Edu? --      Excl. in GC? --      Constitutional: Alert and oriented. Well appearing and in no acute distress. Eyes: Conjunctivae are normal. PERRL. EOMI. Head: Atraumatic. ENT:  Ears:      Nose: No congestion/rhinnorhea.      Mouth/Throat: Mucous membranes are moist.  Neck: No stridor.  Cardiovascular: Good peripheral circulation. Respiratory: Normal respiratory effort without tachypnea or retractions.  Musculoskeletal: Full range of motion to all extremities. No gross deformities appreciated. Neurologic:  Normal speech and language. No gross focal neurologic deficits are appreciated.  Skin:  Skin is warm, dry and intact. No rash noted. Psychiatric: Mood and affect are normal. Speech and behavior are normal. Patient exhibits appropriate insight and judgement.   ____________________________________________   LABS (all labs ordered are listed, but only abnormal results are displayed)  Labs Reviewed  GLUCOSE, CAPILLARY - Abnormal;  Notable for the following components:      Result Value   Glucose-Capillary 277 (*)    All other components within normal limits   ____________________________________________  EKG   ____________________________________________  RADIOLOGY   No results found.  ____________________________________________    PROCEDURES  Procedure(s) performed:    Procedures    Medications - No data to display   ____________________________________________   INITIAL IMPRESSION / ASSESSMENT AND PLAN / ED COURSE  Pertinent labs & imaging results that were available during my care of the patient were reviewed by me and considered in my medical decision making (see chart for details).  Review of the [place] CSRS was performed in accordance of the [facility] prior to dispensing any controlled drugs.     Patient presented to the emergency department for medication refill.  Vital signs and exam are reassuring.  Patient is asymptomatic.  He has an appointment with PCP on Friday.  Patient will be discharged home with prescriptions for glipizide and metformin. Patient is to follow up with PCP as directed. Patient is given ED precautions to return to the ED for any worsening or new symptoms.     ____________________________________________  FINAL CLINICAL IMPRESSION(S) / ED DIAGNOSES  Final diagnoses:  Medication refill      NEW MEDICATIONS STARTED DURING THIS VISIT:  ED Discharge Orders        Ordered    glipiZIDE (GLUCOTROL) 5 MG tablet  Daily before breakfast     04/25/18 1544    metFORMIN (GLUCOPHAGE) 1000 MG tablet  2 times daily with meals     04/25/18 1544          This chart was dictated using voice recognition software/Dragon. Despite best efforts to proofread, errors can occur which can change the meaning. Any change was purely unintentional.    Enid Derry, PA-C 04/25/18 1622    Dionne Bucy, MD 04/25/18 [id number]

## 2019-10-27 ENCOUNTER — Other Ambulatory Visit: Payer: Self-pay | Admitting: Ophthalmology

## 2021-02-07 ENCOUNTER — Other Ambulatory Visit: Payer: Self-pay | Admitting: Physician Assistant

## 2021-02-07 DIAGNOSIS — E119 Type 2 diabetes mellitus without complications: Secondary | ICD-10-CM

## 2022-04-28 ENCOUNTER — Emergency Department: Payer: Self-pay

## 2022-04-28 ENCOUNTER — Other Ambulatory Visit: Payer: Self-pay

## 2022-04-28 ENCOUNTER — Emergency Department
Admission: EM | Admit: 2022-04-28 | Discharge: 2022-04-28 | Disposition: A | Discharge status: Home / Self Care | Payer: Self-pay | Attending: Emergency Medicine | Admitting: Emergency Medicine

## 2022-04-28 DIAGNOSIS — I10 Essential (primary) hypertension: Secondary | ICD-10-CM | POA: Insufficient documentation

## 2022-04-28 DIAGNOSIS — M79605 Pain in left leg: Secondary | ICD-10-CM

## 2022-04-28 DIAGNOSIS — L03116 Cellulitis of left lower limb: Secondary | ICD-10-CM | POA: Insufficient documentation

## 2022-04-28 DIAGNOSIS — E119 Type 2 diabetes mellitus without complications: Secondary | ICD-10-CM | POA: Insufficient documentation

## 2022-04-28 DIAGNOSIS — D72829 Elevated white blood cell count, unspecified: Secondary | ICD-10-CM | POA: Insufficient documentation

## 2022-04-28 LAB — CBC WITH DIFFERENTIAL/PLATELET
Abs Immature Granulocytes: 0.06 10*3/uL (ref 0.00–0.07)
Basophils Absolute: 0 10*3/uL (ref 0.0–0.1)
Basophils Relative: 0 %
Eosinophils Absolute: 0.1 10*3/uL (ref 0.0–0.5)
Eosinophils Relative: 1 %
HCT: 44.4 % (ref 39.0–52.0)
Hemoglobin: 15 g/dL (ref 13.0–17.0)
Immature Granulocytes: 1 %
Lymphocytes Relative: 18 %
Lymphs Abs: 2 10*3/uL (ref 0.7–4.0)
MCH: 28.4 pg (ref 26.0–34.0)
MCHC: 33.8 g/dL (ref 30.0–36.0)
MCV: 83.9 fL (ref 80.0–100.0)
Monocytes Absolute: 1 10*3/uL (ref 0.1–1.0)
Monocytes Relative: 9 %
Neutro Abs: 8.2 10*3/uL — ABNORMAL HIGH (ref 1.7–7.7)
Neutrophils Relative %: 71 %
Platelets: 249 10*3/uL (ref 150–400)
RBC: 5.29 MIL/uL (ref 4.22–5.81)
RDW: 12.3 % (ref 11.5–15.5)
WBC: 11.4 10*3/uL — ABNORMAL HIGH (ref 4.0–10.5)
nRBC: 0 % (ref 0.0–0.2)

## 2022-04-28 LAB — COMPREHENSIVE METABOLIC PANEL
ALT: 32 U/L (ref 0–44)
AST: 25 U/L (ref 15–41)
Albumin: 3.7 g/dL (ref 3.5–5.0)
Alkaline Phosphatase: 60 U/L (ref 38–126)
Anion gap: 10 (ref 5–15)
BUN: 26 mg/dL — ABNORMAL HIGH (ref 8–23)
CO2: 23 mmol/L (ref 22–32)
Calcium: 9 mg/dL (ref 8.9–10.3)
Chloride: 100 mmol/L (ref 98–111)
Creatinine, Ser: 0.79 mg/dL (ref 0.61–1.24)
GFR, Estimated: >60 mL/min (ref >60)
Glucose, Bld: 185 mg/dL — ABNORMAL HIGH (ref 70–99)
Potassium: 3.5 mmol/L (ref 3.5–5.1)
Sodium: 133 mmol/L — ABNORMAL LOW (ref 135–145)
Total Bilirubin: 1.6 mg/dL — ABNORMAL HIGH (ref 0.3–1.2)
Total Protein: 7.2 g/dL (ref 6.5–8.1)

## 2022-04-28 LAB — LACTIC ACID, PLASMA: Lactic Acid, Venous: 1.7 mmol/L (ref 0.5–1.9)

## 2022-04-28 MED ORDER — DOXYCYCLINE MONOHYDRATE 100 MG PO CAPS: 100.0000 mg | ORAL_CAPSULE | Freq: Two times a day (BID) | ORAL | 0 refills | Status: AC

## 2022-04-28 MED ORDER — CEPHALEXIN 500 MG PO CAPS: 500.0000 mg | ORAL_CAPSULE | Freq: Four times a day (QID) | ORAL | 0 refills | Status: AC

## 2022-04-28 NOTE — ED Provider Notes (Signed)
[facility] Provider Note    Event Date/Time   First MD Initiated Contact with Patient 04/28/22 1140     (approximate)   History   Leg Pain   HPI  Steven Lawson is a 66 y.o. male with a past medical history of hypertension, diabetes who presents today for evaluation of leg pain x2-3 days.  Patient reports that he has had left leg pain, swelling, and redness that he noticed 2 nights ago. He reports that it feels warm to the touch.  He denies history of DVT.  Patient reports that on the night of symptom onset he had a fever, though he is unsure how high.  He reports that he has continued to have chills over the last couple of days.  He denies having noticed any wounds to his leg.  He reports that he attempted to climb into bed 2 nights ago and felt a "crack" in his ankle.  He has continued to have swelling since that time.  He reports that he is able to ambulate with a limp.  He does not think that the redness has gotten larger, however he does feel that it has gotten darker.  Patient Active Problem List   Diagnosis Date Noted   Knee pain 02/14/2016   Essential hypertension 02/14/2016   Diabetes (HCC) 11/01/2015   Left hand pain 01/04/2015          Physical Exam   Triage Vital Signs: ED Triage Vitals [04/28/22 1137]  Enc Vitals Group     BP (!) 143/73     Pulse Rate 86     Resp 20     Temp 98.7 F (37.1 C)     Temp Source Oral     SpO2 93 %     Weight 250 lb (113.4 kg)     Height 5\' 8"  (1.727 m)     Head Circumference      Peak Flow      Pain Score 2     Pain Loc      Pain Edu?      Excl. in GC?     Most recent vital signs: Vitals:   04/28/22 1137 04/28/22 1359  BP: (!) 143/73 133/76  Pulse: 86 80  Resp: 20   Temp: 98.7 F (37.1 C)   SpO2: 93% 97%    Physical Exam Vitals and nursing note reviewed.  Constitutional:      General: Awake and alert. No acute distress.    Appearance: Normal appearance. He is well-developed and  obese HENT:     Head: Normocephalic and atraumatic.     Mouth/Throat:     Mouth: Mucous membranes are moist.  Eyes:     General: PERRL. Normal EOMs        Right eye: No discharge.        Left eye: No discharge.     Conjunctiva/sclera: Conjunctivae normal.  Cardiovascular:     Rate and Rhythm: Normal rate and regular rhythm.     Pulses: Normal pulses.     Heart sounds: Normal heart sounds Pulmonary:     Effort: Pulmonary effort is normal. No respiratory distress.     Breath sounds: Normal breath sounds.  Abdominal:     Abdomen is soft. There is no abdominal tenderness. No rebound or guarding. No distention. Musculoskeletal:        General: No swelling. Normal range of motion.     Cervical back: Normal range of motion and neck  supple.  LLE: erythema and swelling to anterior leg from toes to mid anterior leg. Hyperpigmentation noted to anterior leg, minimally TTP. 1+ pitting edema. Dry skin noted, though no obvious wounds. Normal ROM at knee, normal plantarflexion and dorsiflexion at the ankle.  Soft tissue swelling at the level of the ankle.  Normal distal pulses.  Foot is warm and well-perfused.  No crepitus.  No lymphangitis.  Erythema is not circumferential Skin:    General: Skin is warm and dry.     Capillary Refill: Capillary refill takes less than 2 seconds.     Findings: No rash.  Neurological:     Mental Status: He is alert.      ED Results / Procedures / Treatments   Labs (all labs ordered are listed, but only abnormal results are displayed) Labs Reviewed  CBC WITH DIFFERENTIAL/PLATELET - Abnormal; Notable for the following components:      Result Value   WBC 11.4 (*)    Neutro Abs 8.2 (*)    All other components within normal limits  COMPREHENSIVE METABOLIC PANEL - Abnormal; Notable for the following components:   Sodium 133 (*)    Glucose, Bld 185 (*)    BUN 26 (*)    Total Bilirubin 1.6 (*)    All other components within normal limits  CULTURE, BLOOD (ROUTINE  X 2)  CULTURE, BLOOD (ROUTINE X 2)  LACTIC ACID, PLASMA     EKG     RADIOLOGY I independently reviewed patient's x-ray and ultrasound and agree with the radiologist's findings    PROCEDURES:  Critical Care performed:   Procedures   MEDICATIONS ORDERED IN ED: Medications - No data to display   IMPRESSION / MDM / ASSESSMENT AND PLAN / ED COURSE  I reviewed the triage vital signs and the nursing notes.   Differential diagnosis includes, but is not limited to, cellulitis, DVT, less likely abscess, osseous injury. Patient is awake and alert, hemodynamically stable and afebrile without taking antipyretics. He has dry flaking skin and erythema to the dorsum of his foot and anterior leg consistent with possible cellulitis.  Erythema is not circumferential.  No lymphangitis.  No pain out of proportion.  Duplex ultrasound was obtained and demonstrates no DVT.  X-ray was obtained and demonstrates no acute osseous injury, no free air.  Labs are overall reassuring with a slight leukocytosis to 11, normal lactate.  Clinically, no area of fluctuance concerning for abscess.  No lymphangitis or crepitus noted.  Patient is afebrile without antipyretics.  Not consistent with sepsis/SIRS.  He is at increased risk for worsening infection given his diabetes, though currently does not require inpatient admission.  He was discharged home on p.o. antibiotics.  We discussed very strict return precautions that would warrant IV antibiotics.  Patient has full normal active and passive range of motion at his hip, knee, ankle, do not suspect septic arthritis.     FINAL CLINICAL IMPRESSION(S) / ED DIAGNOSES   Final diagnoses:  Cellulitis of left lower extremity  Left leg pain     Rx / DC Orders   ED Discharge Orders          Ordered    cephALEXin (KEFLEX) 500 MG capsule  4 times daily        04/28/22 1342    doxycycline (MONDOXYNE NL) 100 MG capsule  2 times daily        04/28/22 1342              Note:  This document was prepared using Dragon voice recognition software and may include unintentional dictation errors.   Jackelyn Hoehn, PA-C 04/28/22 1446    Merwyn Katos, MD 04/30/22 [phone]

## 2022-04-28 NOTE — ED Triage Notes (Signed)
Pt via POV from home. Pt c/o L pain, swelling, and redness that he noticed Thursday night. Pt states that it is warm to touch. Denies hx of DVT. Pt is A&Ox4 and NAD

## 2022-04-28 NOTE — Discharge Instructions (Addendum)
Take the antibiotics as prescribed.  Keep your leg elevated. Return to the emergency department for:  -- Fever > 100.66F or chills -- Increase pain in the wound -- Increase redness and swelling -- Pus coming from the wound -- Severe pain -- Weakness or numbness in the affected area -- worsening redness -- red streaks up your leg  Or any other new or worsening symptoms. It was a pleasure caring for you.

## 2022-05-03 LAB — CULTURE, BLOOD (ROUTINE X 2)
Culture: NO GROWTH
Culture: NO GROWTH
Special Requests: ADEQUATE
Special Requests: ADEQUATE

## 2022-05-09 ENCOUNTER — Encounter: Payer: Self-pay | Admitting: Emergency Medicine

## 2022-05-09 ENCOUNTER — Ambulatory Visit
Admission: EM | Admit: 2022-05-09 | Discharge: 2022-05-09 | Disposition: A | Discharge status: Home / Self Care | Payer: Self-pay

## 2022-05-09 DIAGNOSIS — L03116 Cellulitis of left lower limb: Secondary | ICD-10-CM

## 2022-05-09 DIAGNOSIS — R600 Localized edema: Secondary | ICD-10-CM

## 2022-05-09 DIAGNOSIS — S81801A Unspecified open wound, right lower leg, initial encounter: Secondary | ICD-10-CM

## 2022-05-09 MED ORDER — DOXYCYCLINE MONOHYDRATE 100 MG PO CAPS: 100.0000 mg | ORAL_CAPSULE | Freq: Two times a day (BID) | ORAL | 0 refills | Status: AC

## 2022-05-09 MED ORDER — SPIRONOLACTONE 25 MG PO TABS: 25.0000 mg | ORAL_TABLET | Freq: Every day | ORAL | 0 refills | Status: AC

## 2022-05-09 NOTE — ED Provider Notes (Signed)
Steven Lawson    CSN: [id number] Arrival date & time: 05/09/22  1330      History   Chief Complaint Chief Complaint  Patient presents with   Leg Infection    HPI Steven Lawson is a 66 y.o. male.   HPI Patient with a medical history of diabetes, obesity, hypertension, presents today for evaluation of left leg cellulitis. Patient seen in ER on 04/28/2022 and treated dually with Doxycyline and Cephalexin. He has two doses of Cephalexin remaining , however continues to have severe swelling and redness mid lower leg. He has a negative DVT work-up during ER visit. He is without a primary care provider. Past Medical History:  Diagnosis Date   Allergy    Arthritis    Diabetes mellitus without complication [facility])     Patient Active Problem List   Diagnosis Date Noted   Knee pain 02/14/2016   Essential hypertension 02/14/2016   Diabetes (HCC) 11/01/2015   Left hand pain 01/04/2015    Past Surgical History:  Procedure Laterality Date   SPINE SURGERY  2001       Home Medications    Prior to Admission medications   Medication Sig Start Date End Date Taking? Authorizing Provider  doxycycline (MONDOXYNE NL) 100 MG capsule Take 1 capsule (100 mg total) by mouth 2 (two) times daily for 10 days. 05/09/22 05/19/22 Yes Bing Neighbors, FNP  spironolactone (ALDACTONE) 25 MG tablet Take 1 tablet (25 mg total) by mouth daily for 7 days. 05/09/22 05/16/22 Yes Bing Neighbors, FNP  aspirin 325 MG tablet Take 325 mg by mouth as needed.    [provider]  glipiZIDE (GLUCOTROL) 5 MG tablet Take 1 tablet (5 mg total) by mouth daily before breakfast. 04/25/18   Enid Derry, PA-C  metFORMIN (GLUCOPHAGE) 1000 MG tablet Take 0.5 tablets (500 mg total) by mouth 2 (two) times daily with a meal. 04/25/18   Enid Derry, PA-C  pravastatin (PRAVACHOL) 10 MG tablet Take 10 mg by mouth daily. 04/11/22   [provider]    Family History Family History  Problem Relation Age  of Onset   Diabetes Mother    Diabetes Father    Pancreatitis Father    Cancer Brother    Heart disease Maternal Grandfather    COPD Paternal Grandfather    Liver disease Brother     Social History Social History   Tobacco Use   Smoking status: Never   Smokeless tobacco: Never  Vaping Use   Vaping Use: Never used  Substance Use Topics   Alcohol use: Yes    Alcohol/week: 0.0 standard drinks    Comment: occasional   Drug use: No     Allergies   Dust mite extract, Morphine and related, and Pollen extract   Review of Systems Review of Systems Pertinent negatives listed in HPI   Physical Exam Triage Vital Signs ED Triage Vitals  Enc Vitals Group     BP 05/09/22 1356 120/70     Pulse Rate 05/09/22 1356 90     Resp 05/09/22 1356 18     Temp 05/09/22 1356 98.3 F (36.8 C)     Temp Source 05/09/22 1356 Oral     SpO2 05/09/22 1356 96 %     Weight --      Height --      Head Circumference --      Peak Flow --      Pain Score 05/09/22 1354 5  Pain Loc --      Pain Edu? --      Excl. in GC? --    No data found.  Updated Vital Signs BP 120/70 (BP Location: Left Arm)   Pulse 90   Temp 98.3 F (36.8 C) (Oral)   Resp 18   SpO2 96%   Visual Acuity Right Eye Distance:   Left Eye Distance:   Bilateral Distance:    Right Eye Near:   Left Eye Near:    Bilateral Near:     Physical Exam Constitutional:      Appearance: He is obese.  HENT:     Head: Normocephalic and atraumatic.  Cardiovascular:     Rate and Rhythm: Normal rate and regular rhythm.  Pulmonary:     Effort: Pulmonary effort is normal.     Breath sounds: Normal breath sounds.  Musculoskeletal:     Right lower leg: 2+ Pitting Edema present.     Left lower leg: 4+ Pitting Edema present.       Legs:  Neurological:     Mental Status: He is alert.   UC Treatments / Results  Labs (all labs ordered are listed, but only abnormal results are displayed) Labs Reviewed - No data to  display  EKG   Radiology No results found.  Procedures Procedures (including critical care time)  Medications Ordered in UC Medications - No data to display  Initial Impression / Assessment and Plan / UC Course  I have reviewed the triage vital signs and the nursing notes.  Pertinent labs & imaging results that were available during my care of the patient were reviewed by me and considered in my medical decision making (see chart for details).    Cellulitis , left leg and wound right lower extremity  Recent labs stable, therefore will trial diuretic therapy with Spirolactone daily for 7 days. Wound care referral placed for evaluation of right mid leg wound. Restarting doxycycline x 10 days.   PCP appointment made here in office 07/03/22 at [facility]. Return if symptoms worsen Final Clinical Impressions(s) / UC Diagnoses   Final diagnoses:  Cellulitis of leg, left  Wound of right lower extremity, initial encounter  Bilateral edema of lower extremity     Discharge Instructions      Treating you with another 10-day course of doxycycline for the left leg cellulitis.  My second concern and you have a small wound developing on your right mid lower leg I have placed a referral to wound care for further management and evaluation. I am placing you on a low-dose of a diuretic called spironolactone.  Take daily for the next 7 days to reduce swelling in your legs.      ED Prescriptions     Medication Sig Dispense Auth. Provider   doxycycline (MONDOXYNE NL) 100 MG capsule Take 1 capsule (100 mg total) by mouth 2 (two) times daily for 10 days. 20 capsule Bing Neighbors, FNP   spironolactone (ALDACTONE) 25 MG tablet Take 1 tablet (25 mg total) by mouth daily for 7 days. 7 tablet Bing Neighbors, FNP      PDMP not reviewed this encounter.   Bing Neighbors, FNP 05/09/22 1436

## 2022-05-09 NOTE — ED Triage Notes (Signed)
Pt was seen in the ED on 04/28/22 for cellulitis. He is done with his abx and wants to be sure that his leg is improving. He does report that the swelling has not gone down and he has a new rash that has showed up.

## 2022-05-09 NOTE — Discharge Instructions (Signed)
Treating you with another 10-day course of doxycycline for the left leg cellulitis.  My second concern and you have a small wound developing on your right mid lower leg I have placed a referral to wound care for further management and evaluation. I am placing you on a low-dose of a diuretic called spironolactone.  Take daily for the next 7 days to reduce swelling in your legs.

## 2022-05-22 ENCOUNTER — Ambulatory Visit: Payer: Self-pay | Admitting: Internal Medicine

## 2022-07-03 ENCOUNTER — Ambulatory Visit: Payer: 59 | Admitting: Nurse Practitioner

## 2023-09-09 DEATH — deceased

## 2024-01-17 IMAGING — DX DG TIBIA/FIBULA 2V*L*
4 series · 4 of 4 positions shown · non-contrast
Comparison: None Available.

CLINICAL DATA: Left leg pain and swelling beginning 3 days ago.

EXAM:
LEFT TIBIA AND FIBULA - 2 VIEW

[tibia ap (1 of 2)]
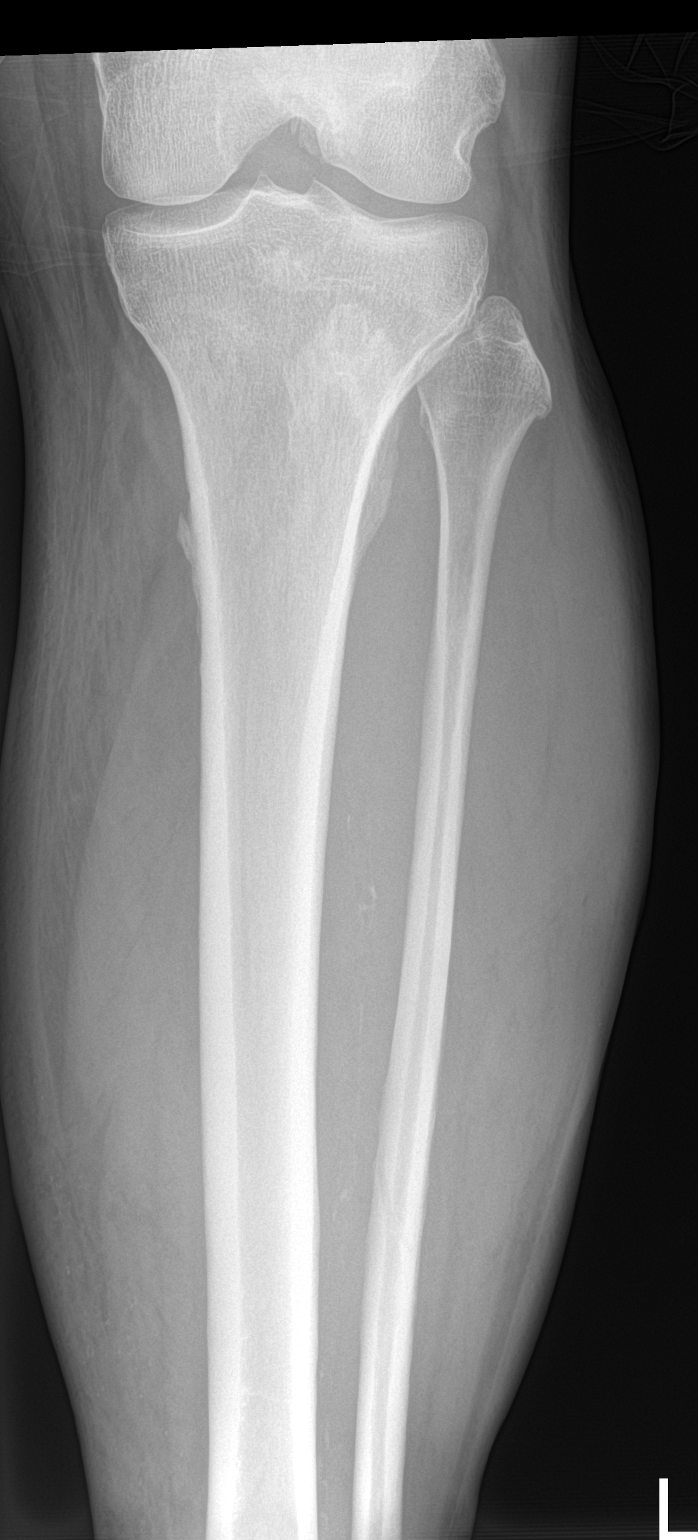

[tibia ap (2 of 2)]
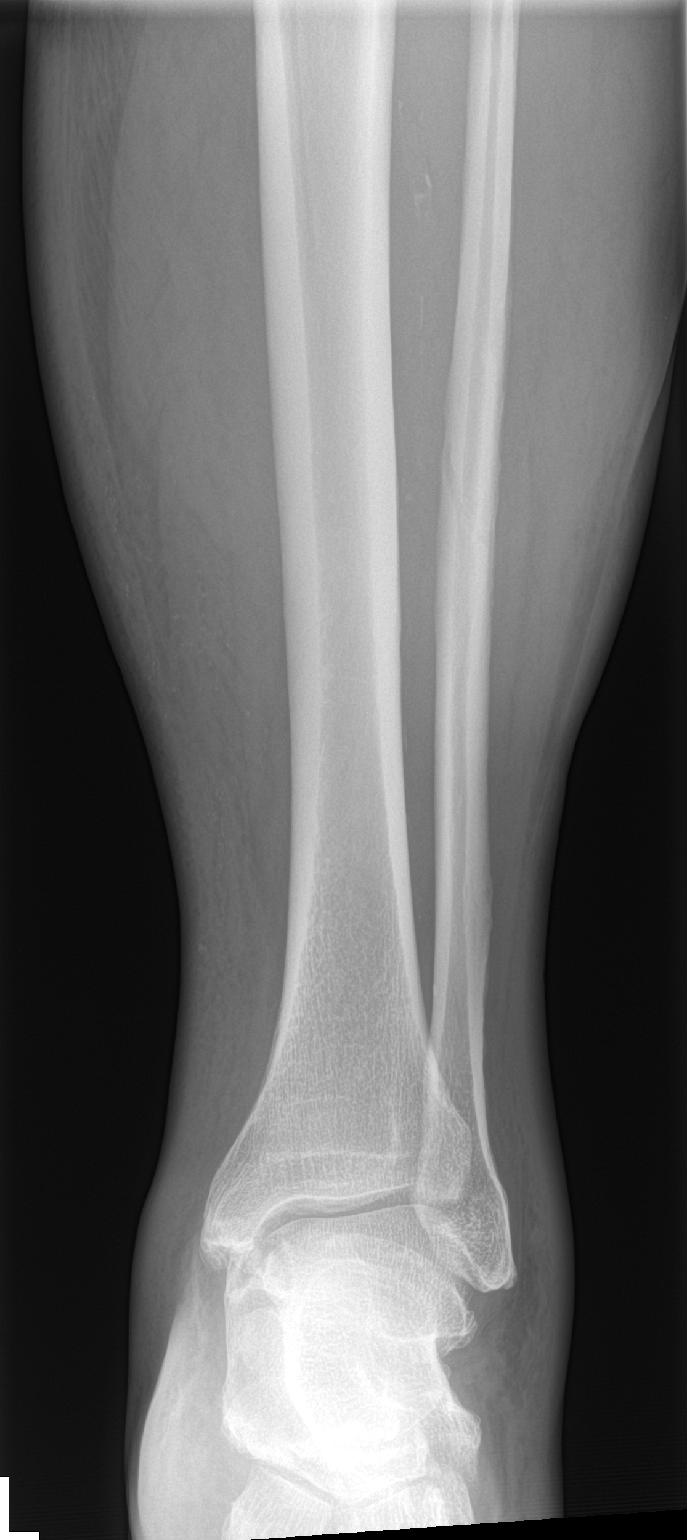

[tibia lat (1 of 2)]
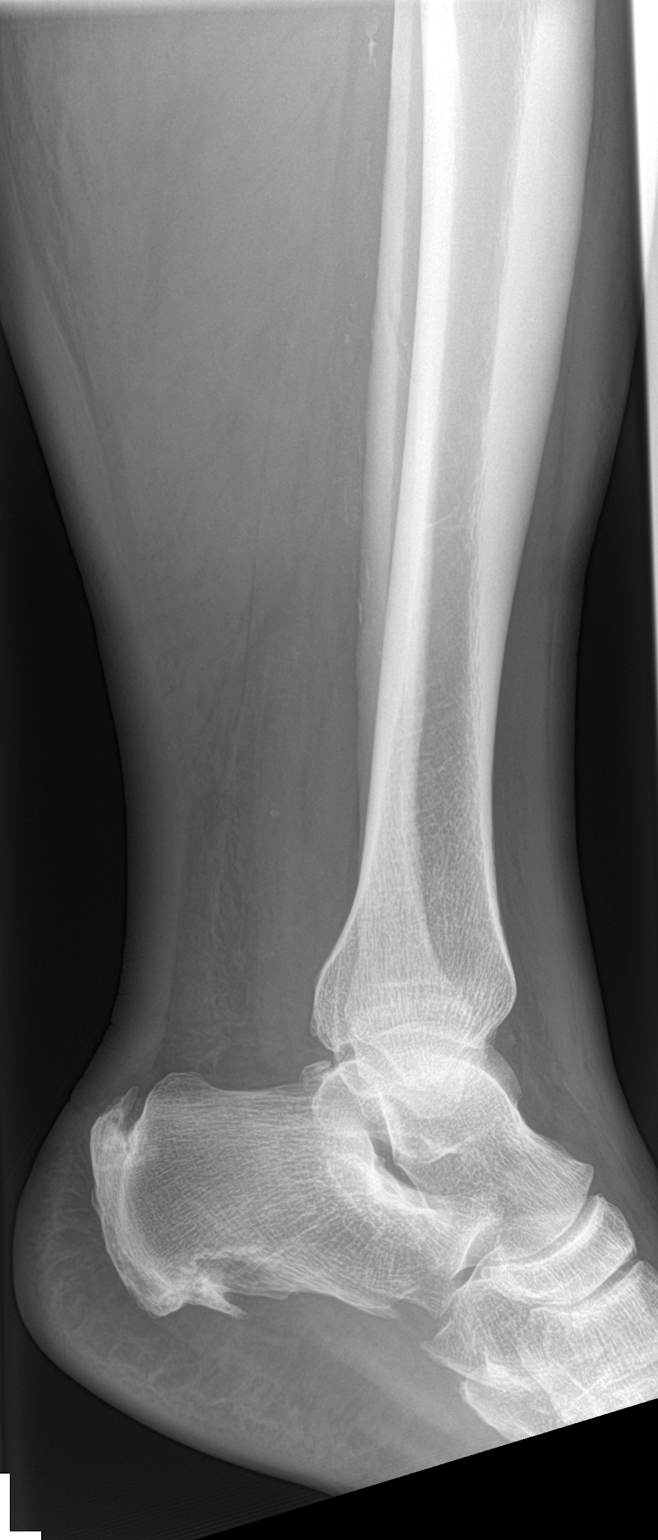

[tibia lat (2 of 2)]
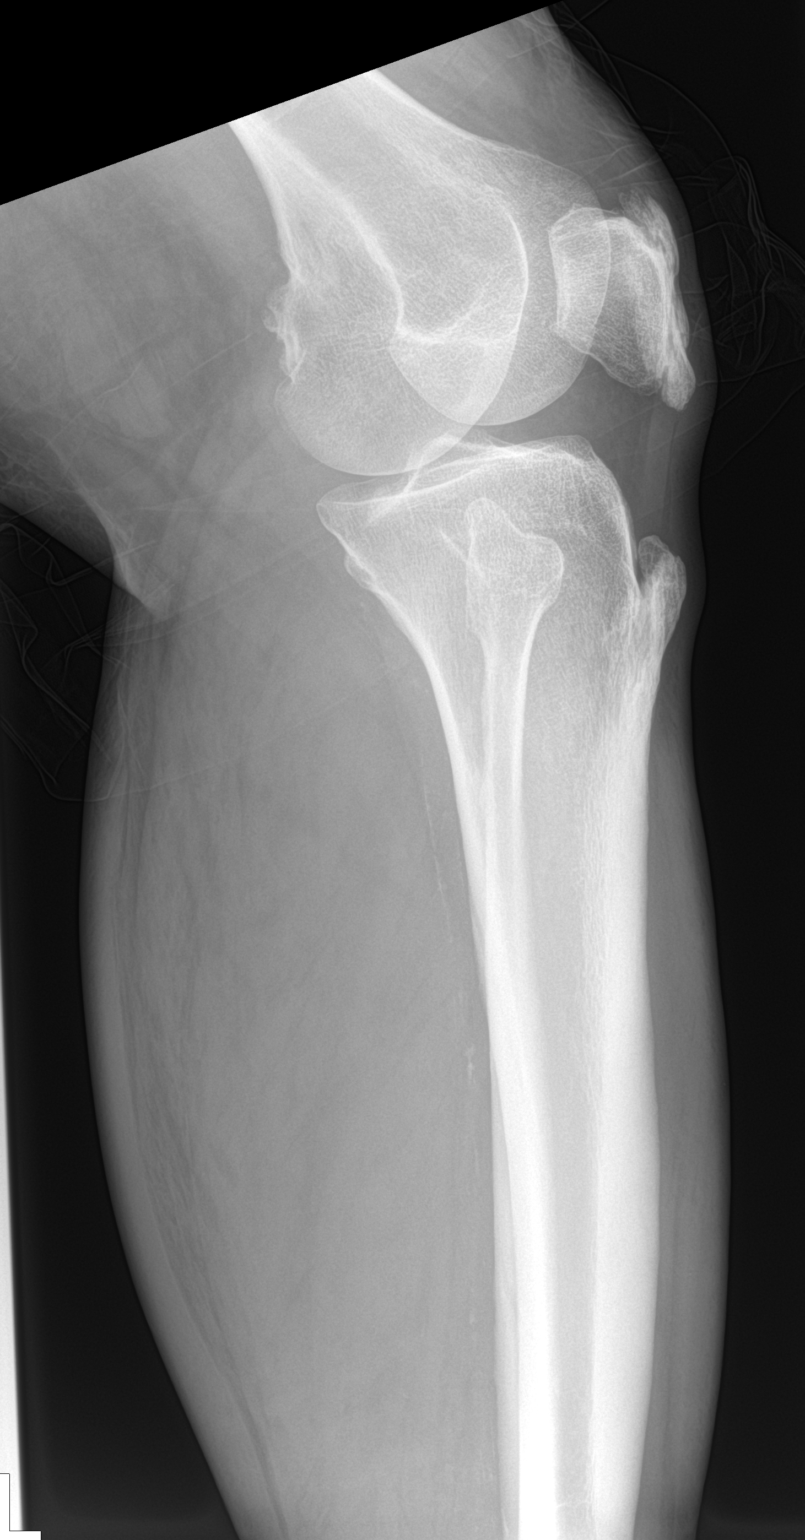

[4 of 4 positions shown; findings below may reference images not displayed]

FINDINGS: There is no evidence of fracture or other focal bone lesions. Calf
soft tissue swelling and mild peripheral vascular calcification
noted. No evidence of soft tissue gas or radiopaque body.
IMPRESSION: Calf soft tissue swelling. No osseous abnormality.

## 2024-01-17 IMAGING — DX DG ANKLE COMPLETE 3+V*L*
3 series · 3 of 3 positions shown · non-contrast
Comparison: None Available.

CLINICAL DATA: Pain and swelling.

EXAM:
LEFT ANKLE COMPLETE - 3+ VIEW

[ankle ap]
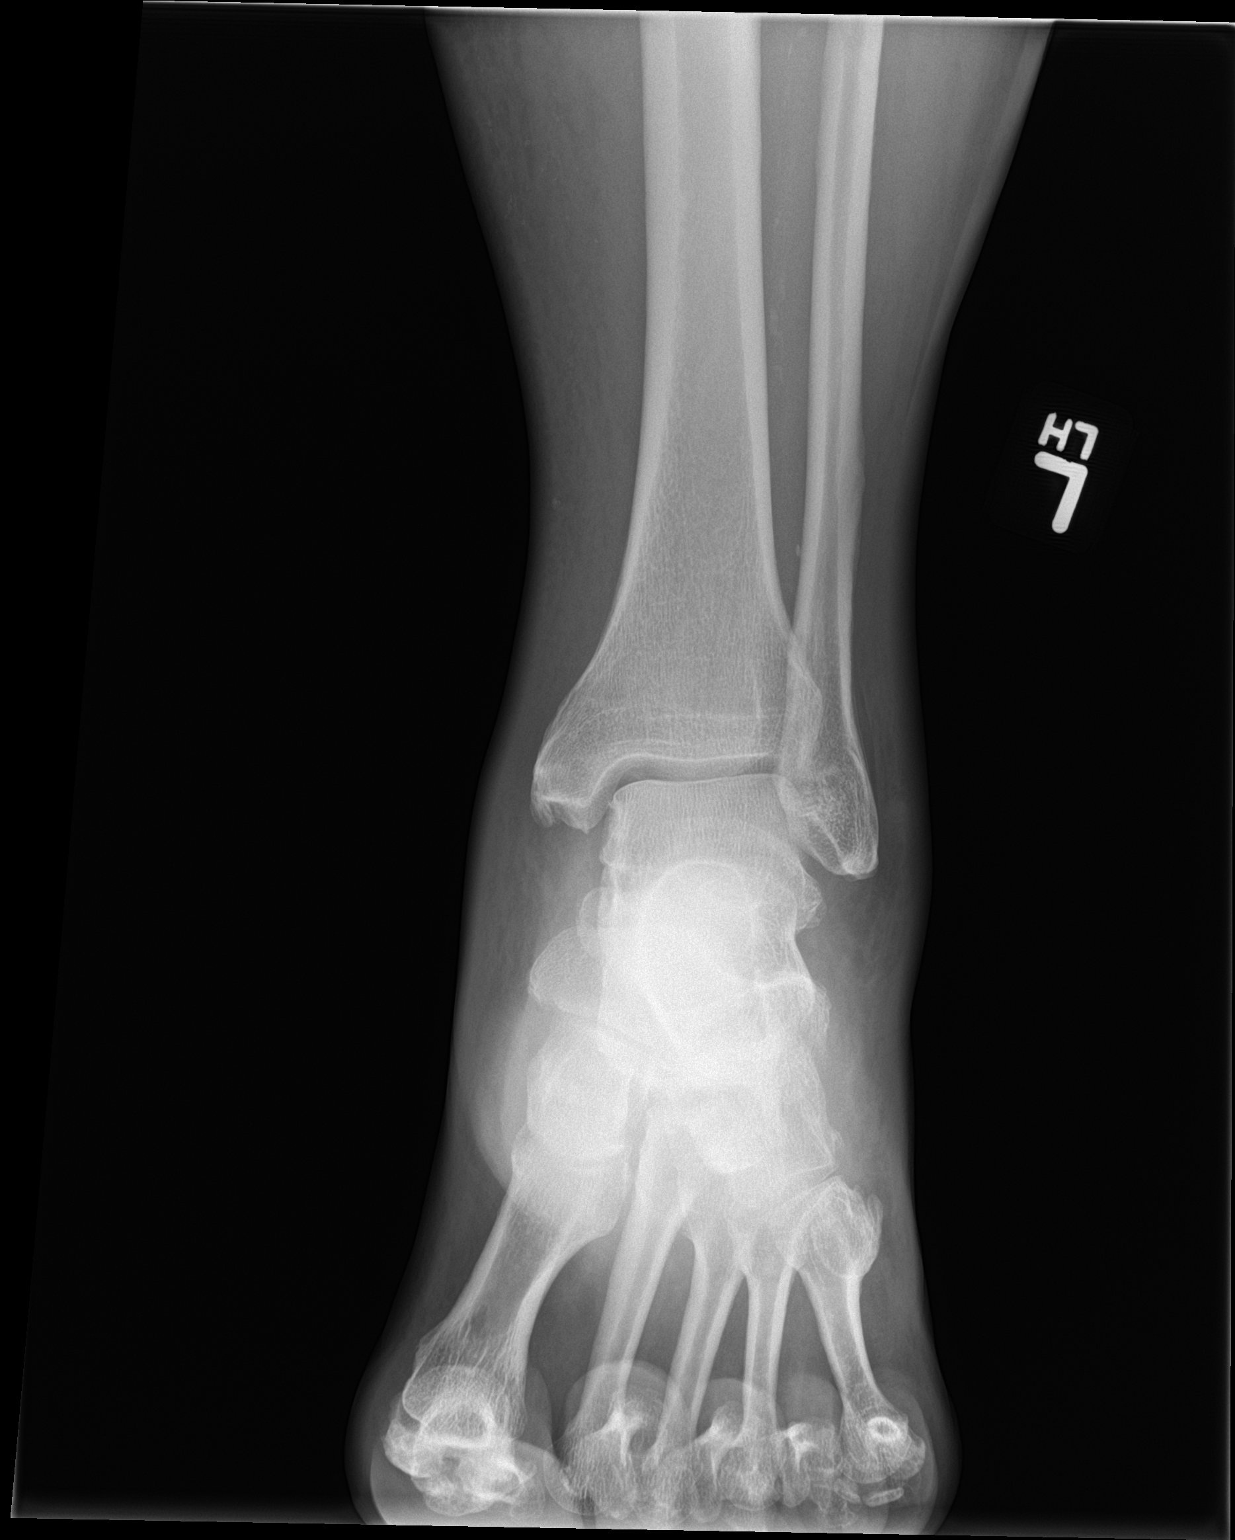

[ankle obl]
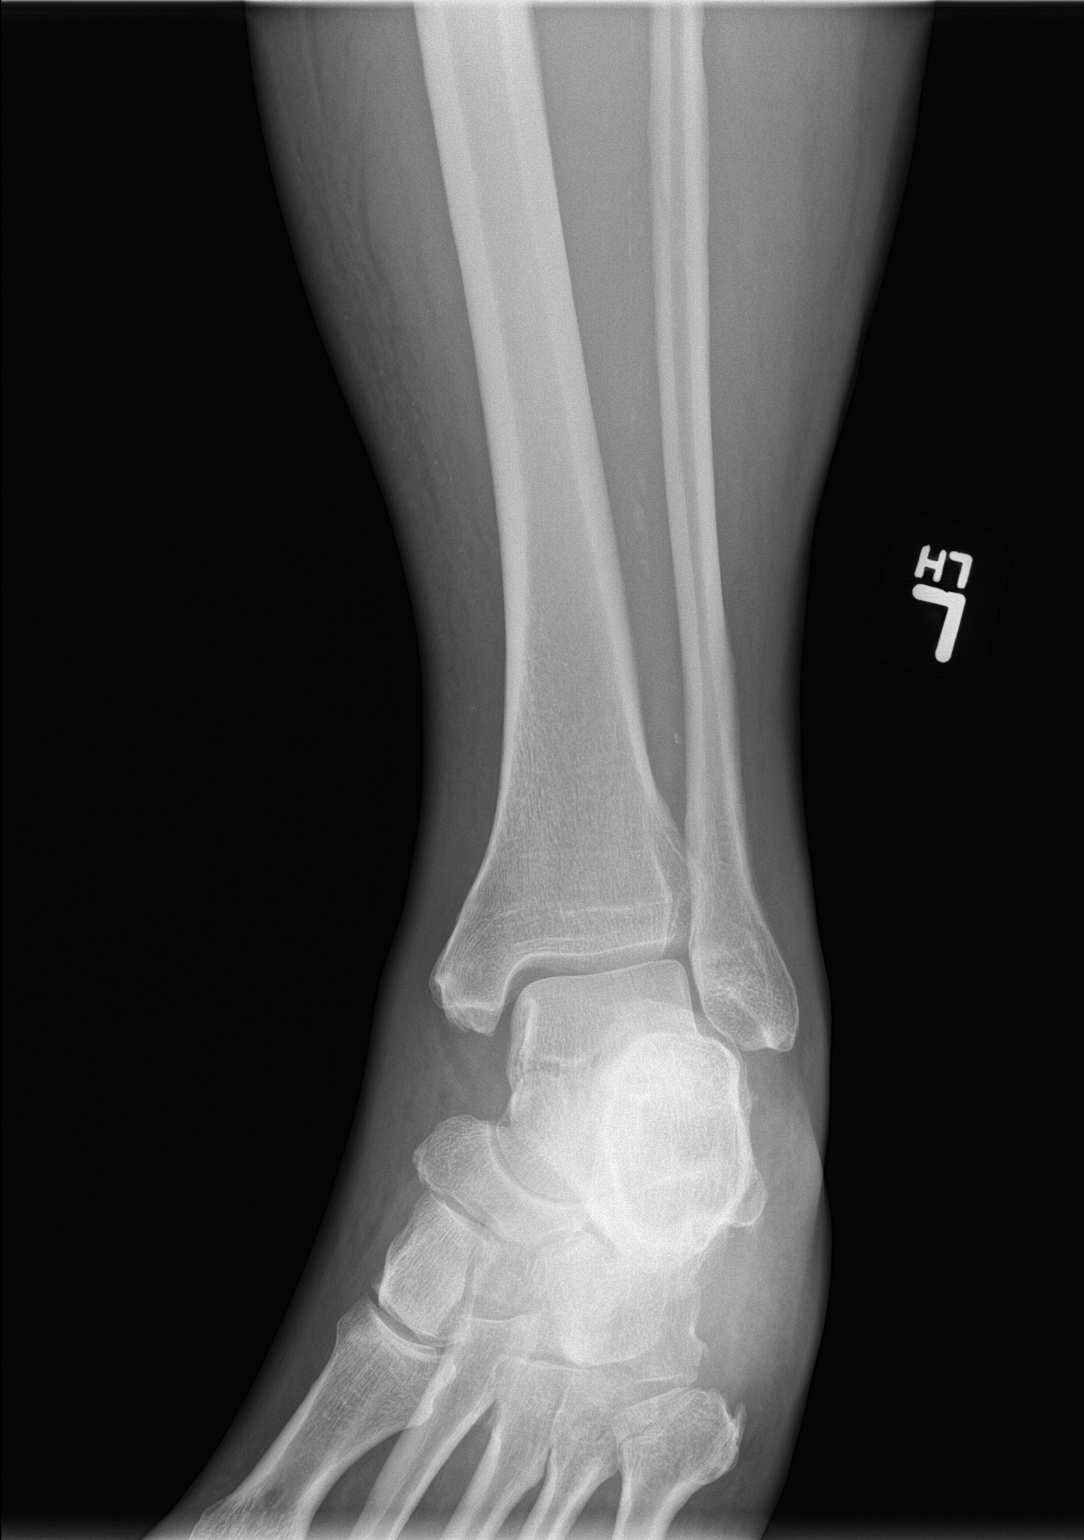

[ankle lat]
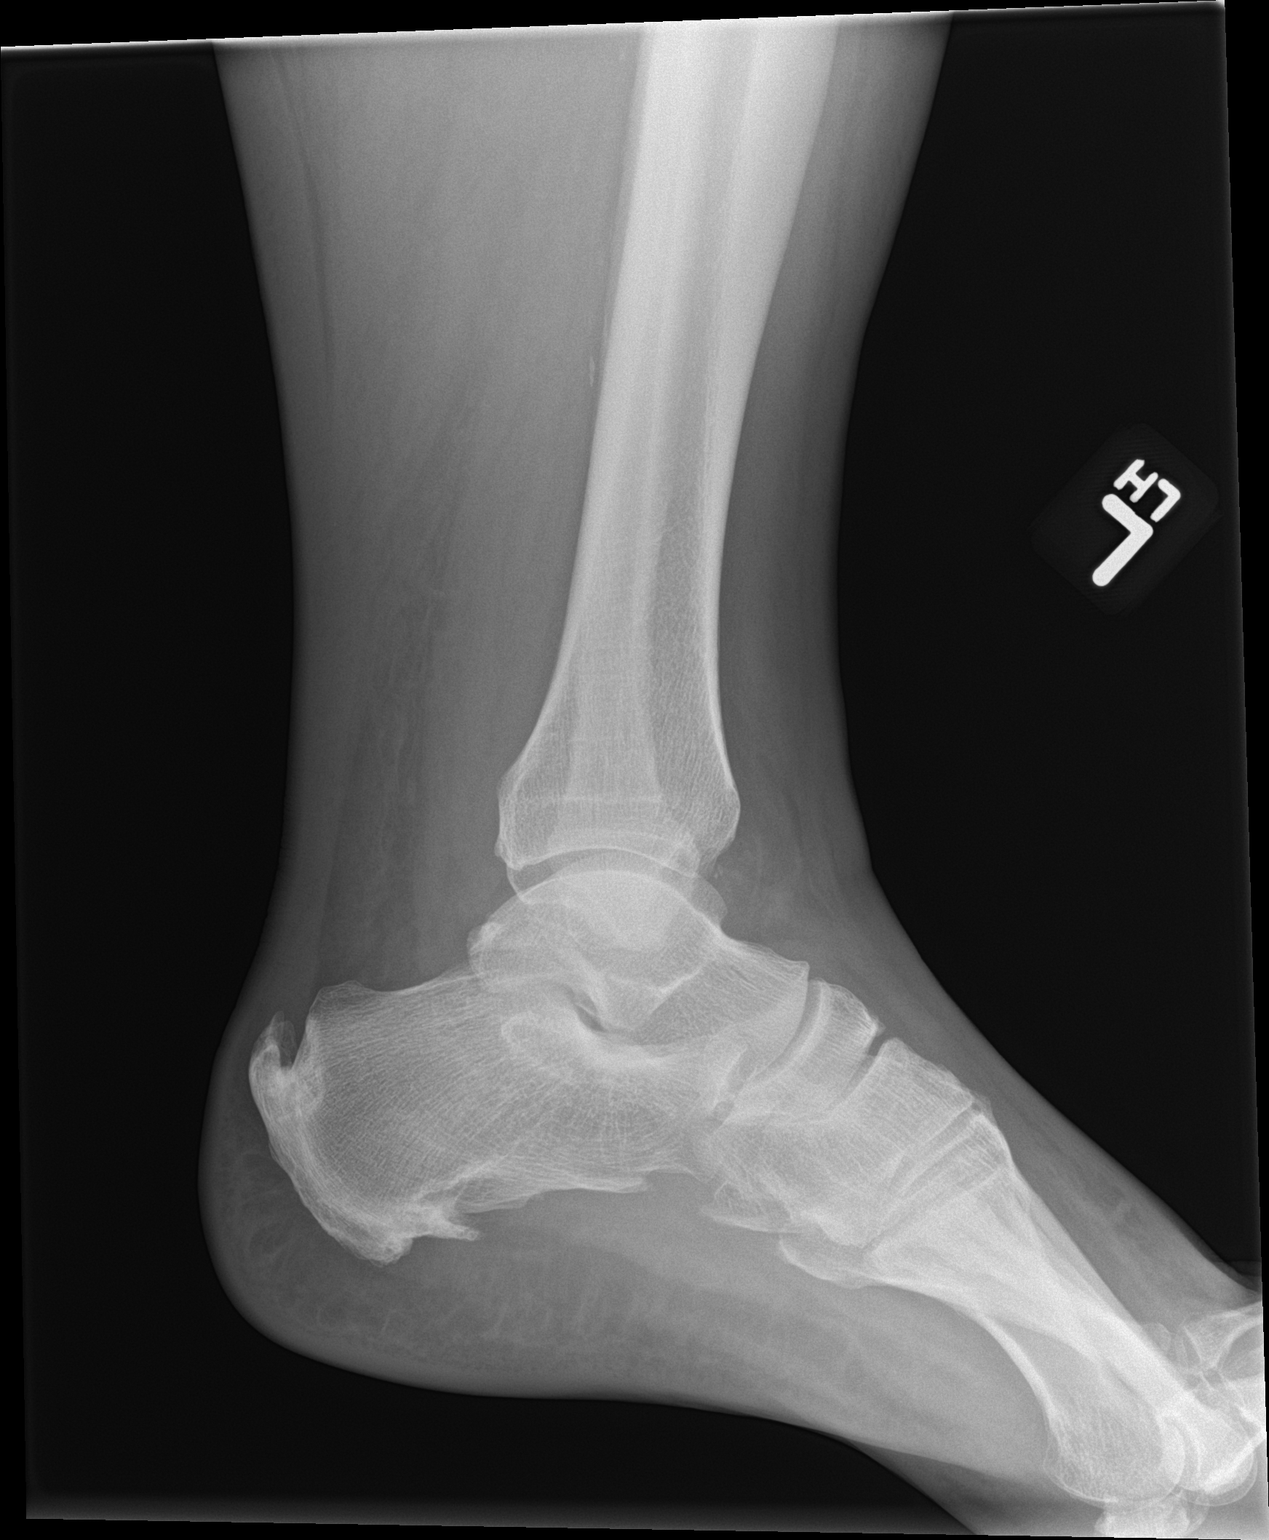

[3 of 3 positions shown; findings below may reference images not displayed]

FINDINGS: Soft tissue swelling associated with the bilateral malleoli. No
fracture or dislocation. The ankle mortise is intact. Vascular
calcifications are identified. Plantar spur off the calcaneus.
Enthesopathic changes at the Achilles insertion site on the
posterior calcaneus. No other acute abnormalities are identified.
IMPRESSION: 1. Bilateral malleolar soft tissue swelling. No fractures
identified. No other acute abnormalities.
